# Patient Record
Sex: Male | Born: 1980 | Race: White | Hispanic: No | Marital: Married | Smoking: Never smoker
Health system: Southern US, Community
[De-identification: ages and names within clinical notes are randomized; demographics above are authoritative.]

## PROBLEM LIST (undated history)

## (undated) HISTORY — PX: ANTERIOR CRUCIATE LIGAMENT REPAIR: SHX115

---

## 2003-04-13 ENCOUNTER — Ambulatory Visit (HOSPITAL_BASED_OUTPATIENT_CLINIC_OR_DEPARTMENT_OTHER): Admission: RE | Admit: 2003-04-13 | Discharge: 2003-04-13 | Payer: Self-pay | Admitting: Orthopedic Surgery

## 2005-02-12 ENCOUNTER — Ambulatory Visit (HOSPITAL_BASED_OUTPATIENT_CLINIC_OR_DEPARTMENT_OTHER): Admission: RE | Admit: 2005-02-12 | Discharge: 2005-02-13 | Payer: Self-pay | Admitting: Orthopedic Surgery

## 2005-02-12 ENCOUNTER — Ambulatory Visit (HOSPITAL_COMMUNITY): Admission: RE | Admit: 2005-02-12 | Discharge: 2005-02-12 | Payer: Self-pay | Admitting: Orthopedic Surgery

## 2006-01-07 ENCOUNTER — Ambulatory Visit (HOSPITAL_BASED_OUTPATIENT_CLINIC_OR_DEPARTMENT_OTHER): Admission: RE | Admit: 2006-01-07 | Discharge: 2006-01-07 | Payer: Self-pay | Admitting: Orthopedic Surgery

## 2014-08-02 ENCOUNTER — Encounter (HOSPITAL_BASED_OUTPATIENT_CLINIC_OR_DEPARTMENT_OTHER): Payer: Self-pay

## 2014-08-02 ENCOUNTER — Emergency Department (HOSPITAL_BASED_OUTPATIENT_CLINIC_OR_DEPARTMENT_OTHER)
Admission: EM | Admit: 2014-08-02 | Discharge: 2014-08-02 | Disposition: A | Discharge status: Home / Self Care | Payer: Self-pay | Attending: Emergency Medicine | Admitting: Emergency Medicine

## 2014-08-02 ENCOUNTER — Emergency Department (HOSPITAL_BASED_OUTPATIENT_CLINIC_OR_DEPARTMENT_OTHER): Payer: Self-pay

## 2014-08-02 DIAGNOSIS — S63502A Unspecified sprain of left wrist, initial encounter: Secondary | ICD-10-CM | POA: Insufficient documentation

## 2014-08-02 DIAGNOSIS — Y9389 Activity, other specified: Secondary | ICD-10-CM | POA: Insufficient documentation

## 2014-08-02 DIAGNOSIS — Y92410 Unspecified street and highway as the place of occurrence of the external cause: Secondary | ICD-10-CM | POA: Insufficient documentation

## 2014-08-02 DIAGNOSIS — Y998 Other external cause status: Secondary | ICD-10-CM | POA: Insufficient documentation

## 2014-08-02 DIAGNOSIS — T148XXA Other injury of unspecified body region, initial encounter: Secondary | ICD-10-CM

## 2014-08-02 DIAGNOSIS — S60512A Abrasion of left hand, initial encounter: Secondary | ICD-10-CM | POA: Insufficient documentation

## 2014-08-02 DIAGNOSIS — Z880 Allergy status to penicillin: Secondary | ICD-10-CM | POA: Insufficient documentation

## 2014-08-02 DIAGNOSIS — S161XXA Strain of muscle, fascia and tendon at neck level, initial encounter: Secondary | ICD-10-CM | POA: Insufficient documentation

## 2014-08-02 MED ORDER — HYDROCODONE-ACETAMINOPHEN 5-325 MG PO TABS: 2.0000 | ORAL_TABLET | ORAL | Status: AC | PRN

## 2014-08-02 MED ORDER — CYCLOBENZAPRINE HCL 10 MG PO TABS: 10.0000 mg | ORAL_TABLET | Freq: Three times a day (TID) | ORAL | Status: AC | PRN
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
## 2014-08-02 NOTE — Discharge Instructions (Signed)
Abrasion An abrasion is a cut or scrape of the skin. Abrasions do not extend through all layers of the skin and most heal within 10 days. It is important to care for your abrasion properly to prevent infection. CAUSES  Most abrasions are caused by falling on, or gliding across, the ground or other surface. When your skin rubs on something, the outer and inner layer of skin rubs off, causing an abrasion. DIAGNOSIS  Your caregiver will be able to diagnose an abrasion during a physical exam.  TREATMENT  Your treatment depends on how large and deep the abrasion is. Generally, your abrasion will be cleaned with water and a mild soap to remove any dirt or debris. An antibiotic ointment may be put over the abrasion to prevent an infection. A bandage (dressing) may be wrapped around the abrasion to keep it from getting dirty.  You may need a tetanus shot if:  You cannot remember when you had your last tetanus shot.  You have never had a tetanus shot.  The injury broke your skin. If you get a tetanus shot, your arm may swell, get red, and feel warm to the touch. This is common and not a problem. If you need a tetanus shot and you choose not to have one, there is a rare chance of getting tetanus. Sickness from tetanus can be serious.  HOME CARE INSTRUCTIONS   If a dressing was applied, change it at least once a day or as directed by your caregiver. If the bandage sticks, soak it off with warm water.   Wash the area with water and a mild soap to remove all the ointment 2 times a day. Rinse off the soap and pat the area dry with a clean towel.   Reapply any ointment as directed by your caregiver. This will help prevent infection and keep the bandage from sticking. Use gauze over the wound and under the dressing to help keep the bandage from sticking.   Change your dressing right away if it becomes wet or dirty.   Only take over-the-counter or prescription medicines for pain, discomfort, or fever as  directed by your caregiver.   Follow up with your caregiver within 24-48 hours for a wound check, or as directed. If you were not given a wound-check appointment, look closely at your abrasion for redness, swelling, or pus. These are signs of infection. SEEK IMMEDIATE MEDICAL CARE IF:   You have increasing pain in the wound.   You have redness, swelling, or tenderness around the wound.   You have pus coming from the wound.   You have a fever or persistent symptoms for more than 2-3 days.  You have a fever and your symptoms suddenly get worse.  You have a bad smell coming from the wound or dressing.  MAKE SURE YOU:   Understand these instructions.  Will watch your condition.  Will get help right away if you are not doing well or get worse. Document Released: 02/05/2005 Document Revised: 04/14/2012 Document Reviewed: 04/01/2011 [facility] Patient Information 2015 [place], [place]. This information is not intended to replace advice given to you by your health care provider. Make sure you discuss any questions you have with your health care provider.  Cervical Sprain A cervical sprain is an injury in the neck in which the strong, fibrous tissues (ligaments) that connect your neck bones stretch or tear. Cervical sprains can range from mild to severe. Severe cervical sprains can cause the neck vertebrae to be unstable.  This can lead to damage of the spinal cord and can result in serious nervous system problems. The amount of time it takes for a cervical sprain to get better depends on the cause and extent of the injury. Most cervical sprains heal in 1 to 3 weeks. CAUSES  Severe cervical sprains may be caused by:   Contact sport injuries (such as from football, rugby, wrestling, hockey, auto racing, gymnastics, diving, martial arts, or boxing).   Motor vehicle collisions.   Whiplash injuries. This is an injury from a sudden forward and backward whipping movement of the head and  neck.  Falls.  Mild cervical sprains may be caused by:   Being in an awkward position, such as while cradling a telephone between your ear and shoulder.   Sitting in a chair that does not offer proper support.   Working at a poorly Marketing executivedesigned computer station.   Looking up or down for long periods of time.  SYMPTOMS   Pain, soreness, stiffness, or a burning sensation in the front, back, or sides of the neck. This discomfort may develop immediately after the injury or slowly, 24 hours or more after the injury.   Pain or tenderness directly in the middle of the back of the neck.   Shoulder or upper back pain.   Limited ability to move the neck.   Headache.   Dizziness.   Weakness, numbness, or tingling in the hands or arms.   Muscle spasms.   Difficulty swallowing or chewing.   Tenderness and swelling of the neck.  DIAGNOSIS  Most of the time your health care provider can diagnose a cervical sprain by taking your history and doing a physical exam. Your health care provider will ask about previous neck injuries and any known neck problems, such as arthritis in the neck. X-rays may be taken to find out if there are any other problems, such as with the bones of the neck. Other tests, such as a CT scan or MRI, may also be needed.  TREATMENT  Treatment depends on the severity of the cervical sprain. Mild sprains can be treated with rest, keeping the neck in place (immobilization), and pain medicines. Severe cervical sprains are immediately immobilized. Further treatment is done to help with pain, muscle spasms, and other symptoms and may include:  Medicines, such as pain relievers, numbing medicines, or muscle relaxants.   Physical therapy. This may involve stretching exercises, strengthening exercises, and posture training. Exercises and improved posture can help stabilize the neck, strengthen muscles, and help stop symptoms from returning.  HOME CARE INSTRUCTIONS    Put ice on the injured area.   Put ice in a plastic bag.   Place a towel between your skin and the bag.   Leave the ice on for 15-20 minutes, 3-4 times a day.   If your injury was severe, you may have been given a cervical collar to wear. A cervical collar is a two-piece collar designed to keep your neck from moving while it heals.  Do not remove the collar unless instructed by your health care provider.  If you have long hair, keep it outside of the collar.  Ask your health care provider before making any adjustments to your collar. Minor adjustments may be required over time to improve comfort and reduce pressure on your chin or on the back of your head.  Ifyou are allowed to remove the collar for cleaning or bathing, follow your health care provider's instructions on how to do  so safely.  Keep your collar clean by wiping it with mild soap and water and drying it completely. If the collar you have been given includes removable pads, remove them every 1-2 days and hand wash them with soap and water. Allow them to air dry. They should be completely dry before you wear them in the collar.  If you are allowed to remove the collar for cleaning and bathing, wash and dry the skin of your neck. Check your skin for irritation or sores. If you see any, tell your health care provider.  Do not drive while wearing the collar.   Only take over-the-counter or prescription medicines for pain, discomfort, or fever as directed by your health care provider.   Keep all follow-up appointments as directed by your health care provider.   Keep all physical therapy appointments as directed by your health care provider.   Make any needed adjustments to your workstation to promote good posture.   Avoid positions and activities that make your symptoms worse.   Warm up and stretch before being active to help prevent problems.  SEEK MEDICAL CARE IF:   Your pain is not controlled with  medicine.   You are unable to decrease your pain medicine over time as planned.   Your activity level is not improving as expected.  SEEK IMMEDIATE MEDICAL CARE IF:   You develop any bleeding.  You develop stomach upset.  You have signs of an allergic reaction to your medicine.   Your symptoms get worse.   You develop new, unexplained symptoms.   You have numbness, tingling, weakness, or paralysis in any part of your body.  MAKE SURE YOU:   Understand these instructions.  Will watch your condition.  Will get help right away if you are not doing well or get worse. Document Released: 02/23/2007 Document Revised: 05/03/2013 Document Reviewed: 11/03/2012 [facility] Patient Information 2015 [place], [place]. This information is not intended to replace advice given to you by your health care provider. Make sure you discuss any questions you have with your health care provider. Wrist Sprain with Rehab A sprain is an injury in which a ligament that maintains the proper alignment of a joint is partially or completely torn. The ligaments of the wrist are susceptible to sprains. Sprains are classified into three categories. Grade 1 sprains cause pain, but the tendon is not lengthened. Grade 2 sprains include a lengthened ligament because the ligament is stretched or partially ruptured. With grade 2 sprains there is still function, although the function may be diminished. Grade 3 sprains are characterized by a complete tear of the tendon or muscle, and function is usually impaired. SYMPTOMS   Pain tenderness, inflammation, and/or bruising (contusion) of the injury.  A "pop" or tear felt and/or heard at the time of injury.  Decreased wrist function. CAUSES  A wrist sprain occurs when a force is placed on one or more ligaments that is greater than it/they can withstand. Common mechanisms of injury include:  Catching a ball with you hands.  Repetitive and/ or strenuous extension or  flexion of the wrist. RISK INCREASES WITH:  Previous wrist injury.  Contact sports (boxing or wrestling).  Activities in which falling is common.  Poor strength and flexibility.  Improperly fitted or padded protective equipment. PREVENTION  Warm up and stretch properly before activity.  Allow for adequate recovery between workouts.  Maintain physical fitness:  Strength, flexibility, and endurance.  Cardiovascular fitness.  Protect the wrist joint by limiting its  motion with the use of taping, braces, or splints.  Protect the wrist after injury for 6 to 12 months. PROGNOSIS  The prognosis for wrist sprains depends on the degree of injury. Grade 1 sprains require 2 to 6 weeks of treatment. Grade 2 sprains require 6 to 8 weeks of treatment, and grade 3 sprains require up to 12 weeks.  RELATED COMPLICATIONS   Prolonged healing time, if improperly treated or re-injured.  Recurrent symptoms that result in a chronic problem.  Injury to nearby structures (bone, cartilage, nerves, or tendons).  Arthritis of the wrist.  Inability to compete in athletics at a high level.  Wrist stiffness or weakness.  Progression to a complete rupture of the ligament. TREATMENT  Treatment initially involves resting from any activities that aggravate the symptoms, and the use of ice and medications to help reduce pain and inflammation. Your caregiver may recommend immobilizing the wrist for a period of time in order to reduce stress on the ligament and allow for healing. After immobilization it is important to perform strengthening and stretching exercises to help regain strength and a full range of motion. These exercises may be completed at home or with a therapist. Surgery is not usually required for wrist sprains, unless the ligament has been ruptured (grade 3 sprain). MEDICATION   If pain medication is necessary, then nonsteroidal anti-inflammatory medications, such as aspirin and ibuprofen,  or other minor pain relievers, such as acetaminophen, are often recommended.  Do not take pain medication for 7 days before surgery.  Prescription pain relievers may be given if deemed necessary by your caregiver. Use only as directed and only as much as you need. HEAT AND COLD  Cold treatment (icing) relieves pain and reduces inflammation. Cold treatment should be applied for 10 to 15 minutes every 2 to 3 hours for inflammation and pain and immediately after any activity that aggravates your symptoms. Use ice packs or massage the area with a piece of ice (ice massage).  Heat treatment may be used prior to performing the stretching and strengthening activities prescribed by your caregiver, physical therapist, or athletic trainer. Use a heat pack or soak your injury in warm water. SEEK MEDICAL CARE IF:  Treatment seems to offer no benefit, or the condition worsens.  Any medications produce adverse side effects. EXERCISES RANGE OF MOTION (ROM) AND STRETCHING EXERCISES - Wrist Sprain  These exercises may help you when beginning to rehabilitate your injury. Your symptoms may resolve with or without further involvement from your physician, physical therapist or athletic trainer. While completing these exercises, remember:   Restoring tissue flexibility helps normal motion to return to the joints. This allows healthier, less painful movement and activity.  An effective stretch should be held for at least 30 seconds.  A stretch should never be painful. You should only feel a gentle lengthening or release in the stretched tissue. RANGE OF MOTION - Wrist Flexion, Active-Assisted  Extend your right / left elbow with your fingers pointing down.*  Gently pull the back of your hand towards you until you feel a gentle stretch on the top of your forearm.  Hold this position for __________ seconds. Repeat __________ times. Complete this exercise __________ times per day.  *If directed by your  physician, physical therapist or athletic trainer, complete this stretch with your elbow bent rather than extended. RANGE OF MOTION - Wrist Extension, Active-Assisted  Extend your right / left elbow and turn your palm upwards.*  Gently pull your palm/fingertips back so  your wrist extends and your fingers point more toward the ground.  You should feel a gentle stretch on the inside of your forearm.  Hold this position for __________ seconds. Repeat __________ times. Complete this exercise __________ times per day. *If directed by your physician, physical therapist or athletic trainer, complete this stretch with your elbow bent, rather than extended. RANGE OF MOTION - Supination, Active  Stand or sit with your elbows at your side. Bend your right / left elbow to 90 degrees.  Turn your palm upward until you feel a gentle stretch on the inside of your forearm.  Hold this position for __________ seconds. Slowly release and return to the starting position. Repeat __________ times. Complete this stretch __________ times per day.  RANGE OF MOTION - Pronation, Active  Stand or sit with your elbows at your side. Bend your right / left elbow to 90 degrees.  Turn your palm downward until you feel a gentle stretch on the top of your forearm.  Hold this position for __________ seconds. Slowly release and return to the starting position. Repeat __________ times. Complete this stretch __________ times per day.  STRETCH - Wrist Flexion  Place the back of your right / left hand on a tabletop leaving your elbow slightly bent. Your fingers should point away from your body.  Gently press the back of your hand down onto the table by straightening your elbow. You should feel a stretch on the top of your forearm.  Hold this position for __________ seconds. Repeat __________ times. Complete this stretch __________ times per day.  STRETCH - Wrist Extension  Place your right / left fingertips on a  tabletop leaving your elbow slightly bent. Your fingers should point backwards.  Gently press your fingers and palm down onto the table by straightening your elbow. You should feel a stretch on the inside of your forearm.  Hold this position for __________ seconds. Repeat __________ times. Complete this stretch __________ times per day.  STRENGTHENING EXERCISES - Wrist Sprain These exercises may help you when beginning to rehabilitate your injury. They may resolve your symptoms with or without further involvement from your physician, physical therapist or athletic trainer. While completing these exercises, remember:   Muscles can gain both the endurance and the strength needed for everyday activities through controlled exercises.  Complete these exercises as instructed by your physician, physical therapist or athletic trainer. Progress with the resistance and repetition exercises only as your caregiver advises. STRENGTH - Wrist Flexors  Sit with your right / left forearm palm-up and fully supported. Your elbow should be resting below the height of your shoulder. Allow your wrist to extend over the edge of the surface.  Loosely holding a __________ weight or a piece of rubber exercise band/tubing, slowly curl your hand up toward your forearm.  Hold this position for __________ seconds. Slowly lower the wrist back to the starting position in a controlled manner. Repeat __________ times. Complete this exercise __________ times per day.  STRENGTH - Wrist Extensors  Sit with your right / left forearm palm-down and fully supported. Your elbow should be resting below the height of your shoulder. Allow your wrist to extend over the edge of the surface.  Loosely holding a __________ weight or a piece of rubber exercise band/tubing, slowly curl your hand up toward your forearm.  Hold this position for __________ seconds. Slowly lower the wrist back to the starting position in a controlled  manner. Repeat __________ times. Complete this exercise  __________ times per day.  STRENGTH - Ulnar Deviators  Stand with a ____________________ weight in your right / left hand, or sit holding on to the rubber exercise band/tubing with your opposite arm supported.  Move your wrist so that your pinkie travels toward your forearm and your thumb moves away from your forearm.  Hold this position for __________ seconds and then slowly lower the wrist back to the starting position. Repeat __________ times. Complete this exercise __________ times per day STRENGTH - Radial Deviators  Stand with a ____________________ weight in your  right / left hand, or sit holding on to the rubber exercise band/tubing with your arm supported.  Raise your hand upward in front of you or pull up on the rubber tubing.  Hold this position for __________ seconds and then slowly lower the wrist back to the starting position. Repeat __________ times. Complete this exercise __________ times per day. STRENGTH - Forearm Supinators  Sit with your right / left forearm supported on a table, keeping your elbow below shoulder height. Rest your hand over the edge, palm down.  Gently grip a hammer or a soup ladle.  Without moving your elbow, slowly turn your palm and hand upward to a "thumbs-up" position.  Hold this position for __________ seconds. Slowly return to the starting position. Repeat __________ times. Complete this exercise __________ times per day.  STRENGTH - Forearm Pronators  Sit with your right / left forearm supported on a table, keeping your elbow below shoulder height. Rest your hand over the edge, palm up.  Gently grip a hammer or a soup ladle.  Without moving your elbow, slowly turn your palm and hand upward to a "thumbs-up" position.  Hold this position for __________ seconds. Slowly return to the starting position. Repeat __________ times. Complete this exercise __________ times per day.   STRENGTH - Grip  Grasp a tennis ball, a dense sponge, or a large, rolled sock in your hand.  Squeeze as hard as you can without increasing any pain.  Hold this position for __________ seconds. Release your grip slowly. Repeat __________ times. Complete this exercise __________ times per day.  Document Released: 04/28/2005 Document Revised: 07/21/2011 Document Reviewed: 08/10/2008 [facility] Patient Information 2015 [place], [place]. This information is not intended to replace advice given to you by your health care provider. Make sure you discuss any questions you have with your health care provider.
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
## 2019-12-02 ENCOUNTER — Other Ambulatory Visit: Payer: Self-pay | Admitting: Physician Assistant

## 2019-12-02 DIAGNOSIS — Z8249 Family history of ischemic heart disease and other diseases of the circulatory system: Secondary | ICD-10-CM

## 2019-12-14 ENCOUNTER — Ambulatory Visit
Admission: RE | Admit: 2019-12-14 | Discharge: 2019-12-14 | Disposition: A | Discharge status: Home / Self Care | Payer: BC Managed Care – PPO | Source: Ambulatory Visit | Attending: Physician Assistant | Admitting: Physician Assistant

## 2019-12-14 DIAGNOSIS — Z8249 Family history of ischemic heart disease and other diseases of the circulatory system: Secondary | ICD-10-CM
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
